# Patient Record
Sex: Female | Born: 2000 | Race: White | Hispanic: No | Marital: Single | Smoking: Never smoker
Health system: Southern US, Community
[De-identification: ages and names within clinical notes are randomized; demographics above are authoritative.]

---
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
## 2022-03-11 ENCOUNTER — Telehealth: Payer: Self-pay | Admitting: Family Medicine

## 2022-03-11 NOTE — Telephone Encounter (Signed)
-----   Message from Alvina Chou sent at 03/01/2022  2:07 PM EDT ----- Regarding: Lab orders for Friday, 7.28.23 Patient is scheduled for CPX labs, please order future labs, Thanks , Camelia Eng

## 2022-03-11 NOTE — Telephone Encounter (Signed)
This is not my patient I have one of the same name and she already had her physical

## 2022-03-12 ENCOUNTER — Other Ambulatory Visit: Payer: Managed Care, Other (non HMO)

## 2022-03-16 ENCOUNTER — Encounter: Payer: Managed Care, Other (non HMO) | Admitting: Family Medicine

## 2022-09-01 ENCOUNTER — Encounter: Payer: Self-pay | Admitting: Oncology

## 2022-09-01 ENCOUNTER — Other Ambulatory Visit: Payer: Self-pay

## 2022-09-01 ENCOUNTER — Ambulatory Visit (INDEPENDENT_AMBULATORY_CARE_PROVIDER_SITE_OTHER): Payer: Commercial Managed Care - PPO | Admitting: Oncology

## 2022-09-01 VITALS — BP 106/72 | HR 85 | Temp 96.3°F | Resp 18 | Ht 64.0 in | Wt 109.0 lb

## 2022-09-01 DIAGNOSIS — H6501 Acute serous otitis media, right ear: Secondary | ICD-10-CM

## 2022-09-01 LAB — POC SOFIA 2 FLU + SARS ANTIGEN FIA
Influenza A, POC: NEGATIVE
Influenza B, POC: NEGATIVE
SARS Coronavirus 2 Ag: NEGATIVE

## 2022-09-01 MED ORDER — AZITHROMYCIN 250 MG PO TABS: ORAL_TABLET | ORAL | 0 refills | Status: AC

## 2022-09-01 NOTE — Progress Notes (Signed)
[place] [place], [place] [address] Phone: [phone] Fax: [phone]   Office Visit Note  Patient Name: Melanie Doyle  Date of [id number]  Med Rec number [id number]  Date of Service: 09/01/2022  Patient has no known allergies.  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 No fevers. Mild low back pain. Has not taken anything otc. No sick contacts. Had one episode of vomiting yesterday morning. No abdominal pain or diarrhea. No fevers. No sick contacts.   Current Medication:  No outpatient encounter medications on file as of 09/01/2022.   No facility-administered encounter medications on file as of 09/01/2022.    Medical History: History reviewed. No pertinent past medical history.  Vital Signs: BP 106/72   Pulse 85   Temp (!) 96.3 F (35.7 C) (Tympanic)   Resp 18   Ht 5\' 4"  (1.626 m)   Wt 109 lb (49.4 kg)   SpO2 99%   BMI 18.71 kg/m   ROS: As per HPI.  All other pertinent ROS negative.     Review of Systems  Constitutional:  Positive for fatigue.  HENT:  Positive for congestion, sinus pressure, sinus pain and sore throat. Negative for trouble swallowing.   Respiratory:  Positive for cough.   Gastrointestinal:  Positive for vomiting. Negative for constipation, diarrhea and nausea.  Musculoskeletal:  Positive for back pain.  Neurological:  Negative for dizziness and headaches.  Hematological:  Negative for adenopathy.    Physical Exam Constitutional:      Appearance: Normal appearance.  HENT:     Right Ear: A middle ear effusion is present. Tympanic membrane is injected and bulging.     Left Ear: Tympanic membrane is bulging.     Nose: Congestion and rhinorrhea present.     Mouth/Throat:     Mouth: Mucous membranes are moist.     Pharynx: Posterior oropharyngeal erythema present. No  pharyngeal swelling.     Tonsils: No tonsillar exudate. 0 on the right. 0 on the left.  Cardiovascular:     Rate and Rhythm: Normal rate and regular rhythm.  Neurological:     General: No focal deficit present.     Mental Status: She is alert and oriented to person, place, and time.     No results found for this or any previous visit (from the past 24 hour(s)).  Assessment/Plan: 1. Non-recurrent acute serous otitis media of right ear -Exam consistent with right otitis media with effusion. -Flu and COVID-negative. -Discussed azithromycin x 5 days.  No allergies that she is aware of. -Recommend Flonase 2 sprays each nostril daily and picking up a decongestant such as Sudafed, Mucinex or DayQuil/NyQuil. -Can use ibuprofen or Tylenol for ear pain or throat pain.  Recommend plenty of fluids including propel or Gatorade and water.  Please let me know if your symptoms fail to improve or worsen.  - azithromycin (ZITHROMAX) 250 MG tablet; Take 2 tablets on day 1, then 1 tablet daily on days 2 through 5  Dispense: 6 tablet; Refill: 0 - POC SOFIA 2 FLU + SARS ANTIGEN FIA   Disposition-return to clinic for no improvement.   General Counseling: Kikuye verbalizes understanding of the findings of todays visit and agrees with plan of treatment. I have discussed any further diagnostic evaluation that may be needed or ordered today. We also reviewed  her medications today. she has been encouraged to call the office with any questions or concerns that should arise related to todays visit.   No orders of the defined types were placed in this encounter.   No orders of the defined types were placed in this encounter.   I spent 20 minutes dedicated to the care of this patient (face-to-face and non-face-to-face) on the date of the encounter to include what is described in the assessment and plan.   Faythe Casa, NP 09/01/2022 10:19 AM
